# Patient Record
Sex: Female | Born: 1975 | Race: White | Hispanic: No | Marital: Single | Smoking: Never smoker
Health system: Southern US, Community
[De-identification: ages and names within clinical notes are randomized; demographics above are authoritative.]

## PROBLEM LIST (undated history)

## (undated) DIAGNOSIS — M797 Fibromyalgia: Secondary | ICD-10-CM

## (undated) DIAGNOSIS — M1990 Unspecified osteoarthritis, unspecified site: Secondary | ICD-10-CM

## (undated) DIAGNOSIS — K219 Gastro-esophageal reflux disease without esophagitis: Secondary | ICD-10-CM

## (undated) DIAGNOSIS — E039 Hypothyroidism, unspecified: Secondary | ICD-10-CM

## (undated) HISTORY — DX: Hypothyroidism, unspecified: E03.9

## (undated) HISTORY — DX: Fibromyalgia: M79.7

## (undated) HISTORY — DX: Unspecified osteoarthritis, unspecified site: M19.90

## (undated) HISTORY — DX: Gastro-esophageal reflux disease without esophagitis: K21.9

## (undated) HISTORY — DX: Morbid (severe) obesity due to excess calories: E66.01

## (undated) HISTORY — PX: KNEE SURGERY: SHX244

---

## 2010-05-22 DIAGNOSIS — M797 Fibromyalgia: Secondary | ICD-10-CM

## 2010-05-22 HISTORY — DX: Fibromyalgia: M79.7

## 2011-10-21 HISTORY — PX: JOINT REPLACEMENT: SHX530

## 2013-11-03 ENCOUNTER — Ambulatory Visit (INDEPENDENT_AMBULATORY_CARE_PROVIDER_SITE_OTHER): Payer: BC Managed Care – PPO | Admitting: Family Medicine

## 2013-11-03 ENCOUNTER — Encounter (INDEPENDENT_AMBULATORY_CARE_PROVIDER_SITE_OTHER): Payer: Self-pay

## 2013-11-03 VITALS — BP 127/82 | HR 90 | Temp 97.6°F | Ht 62.25 in | Wt 240.0 lb

## 2013-11-03 DIAGNOSIS — R0789 Other chest pain: Secondary | ICD-10-CM

## 2013-11-03 DIAGNOSIS — M797 Fibromyalgia: Secondary | ICD-10-CM | POA: Insufficient documentation

## 2013-11-03 DIAGNOSIS — R059 Cough, unspecified: Secondary | ICD-10-CM

## 2013-11-03 DIAGNOSIS — R05 Cough: Secondary | ICD-10-CM | POA: Insufficient documentation

## 2013-11-03 DIAGNOSIS — R071 Chest pain on breathing: Secondary | ICD-10-CM

## 2013-11-03 DIAGNOSIS — IMO0001 Reserved for inherently not codable concepts without codable children: Secondary | ICD-10-CM

## 2013-11-03 MED ORDER — SUCRALFATE 1 GM/10ML PO SUSP: 1.0000 g | Freq: Two times a day (BID) | ORAL | Status: DC

## 2013-11-03 NOTE — Patient Instructions (Signed)
Finish antibiotic  If pain continues - you may need to see pulmonary and/or get an MRI of thoracic spine. Decrease mobic to 1/2 tab daily.

## 2013-11-03 NOTE — Progress Notes (Signed)
Subjective:    Patient ID: Christy Marshall, female    DOB: Jan 15, 1976, 38 y.o.   MRN: [id number]  HPI Patient here today for right side rib pain x 3 months. She has also had a cough and congestion for the same amount of time. She is currently taking Levaquin prescribed by [facility].         There are no active problems to display for this patient.  Outpatient Encounter Prescriptions as of 11/03/2013  Medication Sig  . DULoxetine (CYMBALTA) 60 MG capsule Take 60 mg by mouth daily.  . lansoprazole (PREVACID) 15 MG capsule Take 15 mg by mouth 2 (two) times daily before a meal.  . levothyroxine (SYNTHROID, LEVOTHROID) 25 MCG tablet Take 25 mcg by mouth daily before breakfast.  . meloxicam (MOBIC) 7.5 MG tablet Take 7.5 mg by mouth daily.    Review of Systems  Constitutional: Negative.   HENT: Positive for congestion.   Eyes: Negative.   Respiratory: Positive for cough. Negative for shortness of breath.   Cardiovascular: Negative.   Gastrointestinal: Negative.   Endocrine: Negative.   Genitourinary: Negative.        Right side rib pain  Musculoskeletal: Negative.   Skin: Negative.   Allergic/Immunologic: Negative.   Neurological: Negative.   Hematological: Negative.   Psychiatric/Behavioral: Negative.        Objective:   Physical Exam  Nursing note and vitals reviewed. Constitutional: She is oriented to person, place, and time. She appears well-developed and well-nourished. No distress.  HENT:  Head: Normocephalic and atraumatic.  Right Ear: External ear normal.  Left Ear: External ear normal.  Nose: Nose normal.  Mouth/Throat: Oropharynx is clear and moist.  Eyes: Conjunctivae and EOM are normal. Pupils are equal, round, and reactive to light. Right eye exhibits no discharge. Left eye exhibits no discharge. No scleral icterus.  Neck: Normal range of motion. Neck supple. No thyromegaly present.  Cardiovascular: Normal rate, regular rhythm, normal heart  sounds and intact distal pulses.  Exam reveals no gallop and no friction rub.   No murmur heard. Pulmonary/Chest: Effort normal and breath sounds normal. No respiratory distress. She has no wheezes. She has no rales. She exhibits no tenderness.  Breath sounds were good bilaterally. There was tenderness over the right lateral rib cage and the upper thoracic spine on the right side.  Abdominal: Soft. Bowel sounds are normal.  Musculoskeletal: Normal range of motion. She exhibits tenderness. She exhibits no edema.  Right lateral rib cage  Lymphadenopathy:    She has no cervical adenopathy.  Neurological: She is alert and oriented to person, place, and time.  Skin: Skin is warm and dry. No rash noted.  Psychiatric: She has a normal mood and affect. Her behavior is normal. Judgment and thought content normal.   BP 127/82  Pulse 90  Temp(Src) 97.6 F (36.4 C) (Oral)  Ht 5' 2.25" (1.581 m)  Wt 240 lb (108.863 kg)  BMI 43.55 kg/m2  LMP 10/13/2013        Assessment & Plan:   1. Right-sided chest wall pain  2. Fibromyalgia  3. Cough Patient Instructions  Finish antibiotic  If pain continues - you may need to see pulmonary and/or get an MRI of thoracic spine. Decrease mobic to 1/2 tab daily.    Meds ordered this encounter  Medications  . meloxicam (MOBIC) 7.5 MG tablet    Sig: Take 7.5 mg by mouth daily.  . lansoprazole (PREVACID) 15 MG capsule  Sig: Take 15 mg by mouth 2 (two) times daily before a meal.  . DULoxetine (CYMBALTA) 60 MG capsule    Sig: Take 60 mg by mouth daily.  Marland Kitchen. levothyroxine (SYNTHROID, LEVOTHROID) 25 MCG tablet    Sig: Take 25 mcg by mouth daily before breakfast.  . sucralfate (CARAFATE) 1 GM/10ML suspension    Sig: Take 10 mLs (1 g total) by mouth 2 (two) times daily.    Dispense:  420 mL    Refill:  1   Christy Capeson W. Quientin Jent MD

## 2014-01-01 ENCOUNTER — Ambulatory Visit (INDEPENDENT_AMBULATORY_CARE_PROVIDER_SITE_OTHER): Payer: BC Managed Care – PPO | Admitting: Family Medicine

## 2014-01-01 VITALS — BP 112/75 | HR 85 | Temp 98.1°F | Ht 62.25 in | Wt 231.0 lb

## 2014-01-01 DIAGNOSIS — J029 Acute pharyngitis, unspecified: Secondary | ICD-10-CM

## 2014-01-01 DIAGNOSIS — J069 Acute upper respiratory infection, unspecified: Secondary | ICD-10-CM

## 2014-01-01 LAB — POCT RAPID STREP A (OFFICE): Rapid Strep A Screen: NEGATIVE

## 2014-01-01 MED ORDER — AMOXICILLIN 875 MG PO TABS: 875.0000 mg | ORAL_TABLET | Freq: Two times a day (BID) | ORAL | Status: DC

## 2014-01-01 MED ORDER — FLUCONAZOLE 150 MG PO TABS
150.0000 mg | ORAL_TABLET | Freq: Once | ORAL | Status: DC
Start: 2014-01-01 — End: 2014-05-07

## 2014-01-01 NOTE — Progress Notes (Signed)
   Subjective:    Patient ID: Christy Marshall, female    DOB: 16-Dec-1975, 38 y.o.   MRN: [id number]  HPI  This 38 y.o. female presents for evaluation of URI sx's and sore throat.  Review of Systems C/o sore throat   No chest pain, SOB, HA, dizziness, vision change, N/V, diarrhea, constipation, dysuria, urinary urgency or frequency, myalgias, arthralgias or rash.  Objective:   Physical Exam Vital signs noted  Well developed well nourished female.  HEENT - Head atraumatic Normocephalic                Eyes - PERRLA, Conjuctiva - clear Sclera- Clear EOMI                Ears - EAC's Wnl TM's Wnl Gross Hearing WNL                 Throat - oropharanx wnl Respiratory - Lungs CTA bilateral Cardiac - RRR S1 and S2 without murmur GI - Abdomen soft Nontender and bowel sounds active x 4 Extremities - No edema. Neuro - Grossly intact.  Results for orders placed in visit on 01/01/14  POCT RAPID STREP A (OFFICE)      Result Value Ref Range   Rapid Strep A Screen Negative  Negative        Assessment & Plan:  Sore throat - Plan: POCT rapid strep A, fluconazole (DIFLUCAN) 150 MG tablet, amoxicillin (AMOXIL) 875 MG tablet  URI (upper respiratory infection) - Plan: fluconazole (DIFLUCAN) 150 MG tablet, amoxicillin (AMOXIL) 875 MG tablet  Push po fluids, rest, tylenol and motrin otc prn as directed for fever, arthralgias, and myalgias.  Follow up prn if sx's continue or persist.  Deatra CanterWilliam J Braeden Dolinski FNP

## 2014-05-07 ENCOUNTER — Ambulatory Visit (INDEPENDENT_AMBULATORY_CARE_PROVIDER_SITE_OTHER): Payer: BC Managed Care – PPO | Admitting: Nurse Practitioner

## 2014-05-07 ENCOUNTER — Encounter: Payer: Self-pay | Admitting: Nurse Practitioner

## 2014-05-07 VITALS — BP 128/86 | HR 83 | Temp 97.7°F | Ht 62.25 in | Wt 246.0 lb

## 2014-05-07 DIAGNOSIS — J0100 Acute maxillary sinusitis, unspecified: Secondary | ICD-10-CM

## 2014-05-07 MED ORDER — AZITHROMYCIN 250 MG PO TABS: ORAL_TABLET | ORAL | Status: DC

## 2014-05-07 MED ORDER — BENZONATATE 100 MG PO CAPS: 100.0000 mg | ORAL_CAPSULE | Freq: Two times a day (BID) | ORAL | Status: DC | PRN

## 2014-05-07 NOTE — Patient Instructions (Signed)

## 2014-05-07 NOTE — Progress Notes (Signed)
Subjective:    Patient ID: Christy Marshall, female    DOB: [birth year], 38 y.o.   MRN: [id number]  HPI Patient in today c/o cough and congestion that started 1 week ago- Went to urgent care a week ago Sunday and was given amocicillin and prednosone. Felt good for 4-5 days and now feels sick again-     Review of Systems  Constitutional: Negative for fever, chills and appetite change.  HENT: Positive for congestion, ear pain, postnasal drip and rhinorrhea. Negative for trouble swallowing and voice change.   Respiratory: Positive for cough (dry).   Cardiovascular: Negative.   Genitourinary: Negative.   Musculoskeletal: Negative.   Neurological: Negative.   Psychiatric/Behavioral: Negative.   All other systems reviewed and are negative.      Objective:   Physical Exam  Constitutional: She is oriented to person, place, and time. She appears well-developed and well-nourished. No distress.  HENT:  Right Ear: Hearing, tympanic membrane, external ear and ear canal normal.  Left Ear: Hearing, tympanic membrane, external ear and ear canal normal.  Nose: Mucosal edema and rhinorrhea present. Right sinus exhibits maxillary sinus tenderness. Right sinus exhibits no frontal sinus tenderness. Left sinus exhibits maxillary sinus tenderness. Left sinus exhibits no frontal sinus tenderness.  Mouth/Throat: Uvula is midline and oropharynx is clear and moist.  Eyes: Pupils are equal, round, and reactive to light.  Neck: Normal range of motion. Neck supple.  Cardiovascular: Normal rate, regular rhythm and normal heart sounds.   Pulmonary/Chest: Effort normal and breath sounds normal.  Lymphadenopathy:    She has no cervical adenopathy.  Neurological: She is alert and oriented to person, place, and time.  Skin: Skin is warm and dry.  Psychiatric: She has a normal mood and affect. Her behavior is normal. Judgment and thought content normal.   BP 128/86 mmHg  Pulse 83  Temp(Src) 97.7 F (36.5 C) (Oral)   Ht 5' 2.25" (1.581 m)  Wt 246 lb (111.585 kg)  BMI 44.64 kg/m2  LMP 04/16/2014        Assessment & Plan:   1. Acute maxillary sinusitis, recurrence not specified    Meds ordered this encounter  Medications  . azithromycin (ZITHROMAX Z-PAK) 250 MG tablet    Sig: As directed    Dispense:  6 each    Refill:  0    Order Specific Question:  Supervising Provider    Answer:  Ernestina PennaMOORE, DONALD W [[id number]]  . benzonatate (TESSALON) 100 MG capsule    Sig: Take 1 capsule (100 mg total) by mouth 2 (two) times daily as needed for cough.    Dispense:  20 capsule    Refill:  0    Order Specific Question:  Supervising Provider    Answer:  Ernestina PennaMOORE, DONALD W [[id number]]   1. Take meds as prescribed 2. Use a cool mist humidifier especially during the winter months and when heat has been humid. 3. Use saline nose sprays frequently 4. Saline irrigations of the nose can be very helpful if done frequently.  * 4X daily for 1 week*  * Use of a nettie pot can be helpful with this. Follow directions with this* 5. Drink plenty of fluids 6. Keep thermostat turn down low 7.For any cough or congestion  Use plain Mucinex- regular strength or max strength is fine   * Children- consult with Pharmacist for dosing 8. For fever or aces or pains- take tylenol or ibuprofen appropriate for age and weight.  * for fevers greater than  101 orally you may alternate ibuprofen and tylenol every  3 hours.   Mary-Margaret Daphine DeutscherMartin, FNP

## 2014-06-10 ENCOUNTER — Encounter: Payer: Self-pay | Admitting: Family Medicine

## 2014-06-10 ENCOUNTER — Other Ambulatory Visit: Payer: Self-pay | Admitting: Family Medicine

## 2014-06-10 ENCOUNTER — Ambulatory Visit (INDEPENDENT_AMBULATORY_CARE_PROVIDER_SITE_OTHER): Payer: BLUE CROSS/BLUE SHIELD

## 2014-06-10 ENCOUNTER — Ambulatory Visit (INDEPENDENT_AMBULATORY_CARE_PROVIDER_SITE_OTHER): Payer: BLUE CROSS/BLUE SHIELD | Admitting: Family Medicine

## 2014-06-10 VITALS — BP 117/75 | HR 92 | Temp 97.6°F | Ht 62.25 in | Wt 243.0 lb

## 2014-06-10 DIAGNOSIS — M79671 Pain in right foot: Secondary | ICD-10-CM

## 2014-06-10 DIAGNOSIS — E039 Hypothyroidism, unspecified: Secondary | ICD-10-CM

## 2014-06-10 DIAGNOSIS — Z0000 Encounter for general adult medical examination without abnormal findings: Secondary | ICD-10-CM

## 2014-06-10 DIAGNOSIS — M1990 Unspecified osteoarthritis, unspecified site: Secondary | ICD-10-CM

## 2014-06-10 DIAGNOSIS — M797 Fibromyalgia: Secondary | ICD-10-CM

## 2014-06-10 DIAGNOSIS — K219 Gastro-esophageal reflux disease without esophagitis: Secondary | ICD-10-CM

## 2014-06-10 MED ORDER — LEVOTHYROXINE SODIUM 25 MCG PO TABS: 25.0000 ug | ORAL_TABLET | Freq: Every day | ORAL | Status: DC

## 2014-06-10 MED ORDER — LANSOPRAZOLE 15 MG PO CPDR: 15.0000 mg | DELAYED_RELEASE_CAPSULE | Freq: Two times a day (BID) | ORAL | Status: DC

## 2014-06-10 MED ORDER — DULOXETINE HCL 60 MG PO CPEP
60.0000 mg | ORAL_CAPSULE | Freq: Every day | ORAL | Status: DC
Start: 2014-06-10 — End: 2015-04-16

## 2014-06-10 MED ORDER — MELOXICAM 7.5 MG PO TABS: 7.5000 mg | ORAL_TABLET | Freq: Every day | ORAL | Status: DC

## 2014-06-10 NOTE — Patient Instructions (Addendum)
Continue current medications. Continue good therapeutic lifestyle changes which include good diet and exercise. Fall precautions discussed with patient. If an FOBT was given today- please return it to our front desk.    We will call you with the results of the x-rays and we may need to do a referral to Dr. Victorino DikeHewitt an orthopedic specialist who deals with extremities. Return to clinic for fasting lab work so we can monitor your thyroid issues We will also do blood work to check your cholesterol your hemoglobin and your kidney function test for culture taken meloxicam.

## 2014-06-10 NOTE — Addendum Note (Signed)
Addended by: Magdalene RiverBULLINS, Nevan Creighton H on: 06/10/2014 03:34 PM   Modules accepted: Orders

## 2014-06-10 NOTE — Progress Notes (Signed)
Subjective:    Patient ID: Christy Marshall, female    DOB: 10-01-1975, 39 y.o.   MRN: [id number]  HPI Patient here today for check up on chronic health problems. She is taking all medications regularly. The patient is hypothyroid and has fibromyalgia. She takes meloxicam and 25 g of Synthroid. The pain in the right heel has been going on for some time and she denies any injury to this pill. It is excruciating according to her. It is worse after she gets up from a sitting position but it hurts also during the day with walking on it. The patient is also requesting refills on several of her medicines and we will do this for her today and she is continued take them regularly and does have to take the meloxicam for her shoulder pain and her fibromyalgia.        Patient Active Problem List   Diagnosis Date Noted  . Cough 11/03/2013  . Fibromyalgia 11/03/2013   Outpatient Encounter Prescriptions as of 06/10/2014  Medication Sig  . DULoxetine (CYMBALTA) 60 MG capsule Take 60 mg by mouth daily.  . lansoprazole (PREVACID) 15 MG capsule Take 15 mg by mouth 2 (two) times daily before a meal.  . levothyroxine (SYNTHROID, LEVOTHROID) 25 MCG tablet Take 25 mcg by mouth daily before breakfast.  . meloxicam (MOBIC) 7.5 MG tablet Take 7.5 mg by mouth daily.  . [DISCONTINUED] azithromycin (ZITHROMAX Z-PAK) 250 MG tablet As directed  . [DISCONTINUED] benzonatate (TESSALON) 100 MG capsule Take 1 capsule (100 mg total) by mouth 2 (two) times daily as needed for cough.    Review of Systems  Constitutional: Negative.   HENT: Negative.   Eyes: Negative.   Respiratory: Negative.   Cardiovascular: Negative.   Gastrointestinal: Negative.   Endocrine: Negative.   Genitourinary: Negative.   Musculoskeletal: Positive for arthralgias (right heel pain).  Skin: Negative.   Allergic/Immunologic: Negative.   Neurological: Negative.   Hematological: Negative.   Psychiatric/Behavioral: Negative.          Objective:   Physical Exam  Constitutional: She is oriented to person, place, and time. She appears well-developed and well-nourished. No distress.  HENT:  Head: Normocephalic and atraumatic.  Eyes: Conjunctivae and EOM are normal. Pupils are equal, round, and reactive to light. Right eye exhibits no discharge. Left eye exhibits no discharge. No scleral icterus.  Neck: Normal range of motion. Neck supple. No thyromegaly present.  Cardiovascular: Normal rate, regular rhythm, normal heart sounds and intact distal pulses.   No murmur heard. At 72/m  Pulmonary/Chest: Effort normal and breath sounds normal. No respiratory distress. She has no wheezes. She has no rales. She exhibits no tenderness.  Abdominal: Soft. There is no tenderness. There is no rebound and no guarding.  Musculoskeletal: Normal range of motion. She exhibits no edema or tenderness.  She has had surgery on both knees and was complaining also of numbness anterior to both knees. There is no pain just numbness. She has good mobility without any redness or inflammation noted  Lymphadenopathy:    She has no cervical adenopathy.  Neurological: She is alert and oriented to person, place, and time. No cranial nerve deficit.  Skin: Skin is warm and dry. No rash noted. No erythema.  Psychiatric: She has a normal mood and affect. Her behavior is normal. Judgment and thought content normal.  Nursing note and vitals reviewed.  BP 117/75 mmHg  Pulse 92  Temp(Src) 97.6 F (36.4 C) (Oral)  Ht 5' 2.25" (1.581  m)  Wt 243 lb (110.224 kg)  BMI 44.10 kg/m2  LMP 05/13/2014  [facility] reading (PRIMARY) by  Dr. Governor Rooks heel--  No heel spur or no reason for heel pain                                      Assessment & Plan:  1. Hypothyroidism, unspecified hypothyroidism type -We will check a thyroid profile and make any adjustments with the medicine when the results are back. For now continue 25 g of thyroid daily - POCT CBC; Future -  Thyroid Panel With TSH; Future  2. Gastroesophageal reflux disease, esophagitis presence not specified -Continue to protect her stomach from the meloxicam. Take as little as you can get by with an Allis take it after eating. - POCT CBC; Future - Hepatic function panel; Future  3. Fibromyalgia -Continue with meloxicam and Cymbalta - POCT CBC; Future  4. Arthritis -Continue meloxicam - POCT CBC; Future  5. Health care maintenance - POCT CBC; Future - BMP8+EGFR; Future - Hepatic function panel; Future - NMR, lipoprofile; Future - Thyroid Panel With TSH; Future - Vit D  25 hydroxy (rtn osteoporosis monitoring); Future  6. Heel pain, right -X-ray will be reviewed and appropriate referral will be made depending on findings. - DG Foot Complete Right; Future   All medications will be refilled for this patient during the visit today.  Patient Instructions  Continue current medications. Continue good therapeutic lifestyle changes which include good diet and exercise. Fall precautions discussed with patient. If an FOBT was given today- please return it to our front desk.    We will call you with the results of the x-rays and we may need to do a referral to Dr. Doran Durand an orthopedic specialist who deals with extremities. Return to clinic for fasting lab work so we can monitor your thyroid issues We will also do blood work to check your cholesterol your hemoglobin and your kidney function test for culture taken meloxicam.   Arrie Senate MD

## 2014-06-17 ENCOUNTER — Encounter (INDEPENDENT_AMBULATORY_CARE_PROVIDER_SITE_OTHER): Payer: Self-pay

## 2014-06-17 ENCOUNTER — Other Ambulatory Visit (INDEPENDENT_AMBULATORY_CARE_PROVIDER_SITE_OTHER): Payer: BLUE CROSS/BLUE SHIELD

## 2014-06-17 DIAGNOSIS — M1990 Unspecified osteoarthritis, unspecified site: Secondary | ICD-10-CM

## 2014-06-17 DIAGNOSIS — M797 Fibromyalgia: Secondary | ICD-10-CM

## 2014-06-17 DIAGNOSIS — E039 Hypothyroidism, unspecified: Secondary | ICD-10-CM

## 2014-06-17 DIAGNOSIS — Z0000 Encounter for general adult medical examination without abnormal findings: Secondary | ICD-10-CM

## 2014-06-17 DIAGNOSIS — K219 Gastro-esophageal reflux disease without esophagitis: Secondary | ICD-10-CM

## 2014-06-17 LAB — POCT CBC
GRANULOCYTE PERCENT: 76 % (ref 37–80)
HEMATOCRIT: 39.8 % (ref 37.7–47.9)
Hemoglobin: 12.8 g/dL (ref 12.2–16.2)
Lymph, poc: 1.6 (ref 0.6–3.4)
MCH: 26.9 pg — AB (ref 27–31.2)
MCHC: 32.2 g/dL (ref 31.8–35.4)
MCV: 83.4 fL (ref 80–97)
MPV: 10.6 fL (ref 0–99.8)
POC Granulocyte: 5.5 (ref 2–6.9)
POC LYMPH PERCENT: 22.2 %L (ref 10–50)
Platelet Count, POC: 210 10*3/uL (ref 142–424)
RBC: 4.8 M/uL (ref 4.04–5.48)
RDW, POC: 14.4 %
WBC: 7.2 10*3/uL (ref 4.6–10.2)

## 2014-06-17 NOTE — Progress Notes (Signed)
Lab only 

## 2014-06-18 LAB — NMR, LIPOPROFILE
Cholesterol: 137 mg/dL (ref 100–199)
HDL Cholesterol by NMR: 35 mg/dL — ABNORMAL LOW (ref >39)
HDL Particle Number: 29.3 umol/L — ABNORMAL LOW (ref >=30.5)
LDL Particle Number: 882 nmol/L (ref <1000)
LDL Size: 20.5 nm (ref >20.5)
LDL-C: 84 mg/dL (ref 0–99)
LP-IR Score: 66 — ABNORMAL HIGH (ref <=45)
SMALL LDL PARTICLE NUMBER: 609 nmol/L — AB (ref <=527)
TRIGLYCERIDES BY NMR: 90 mg/dL (ref 0–149)

## 2014-06-18 LAB — HEPATIC FUNCTION PANEL
ALBUMIN: 4.2 g/dL (ref 3.5–5.5)
ALT: 21 IU/L (ref 0–32)
AST: 36 IU/L (ref 0–40)
Alkaline Phosphatase: 87 IU/L (ref 39–117)
Bilirubin, Direct: 0.1 mg/dL (ref 0.00–0.40)
Total Bilirubin: 0.3 mg/dL (ref 0.0–1.2)
Total Protein: 7.1 g/dL (ref 6.0–8.5)

## 2014-06-18 LAB — BMP8+EGFR
BUN/Creatinine Ratio: 21 (ref 9–23)
BUN: 14 mg/dL (ref 6–24)
CALCIUM: 9.4 mg/dL (ref 8.7–10.2)
CO2: 27 mmol/L (ref 18–29)
Chloride: 99 mmol/L (ref 97–108)
Creatinine, Ser: 0.67 mg/dL (ref 0.57–1.00)
GFR calc Af Amer: 127 mL/min/{1.73_m2} (ref >59)
GFR calc non Af Amer: 110 mL/min/{1.73_m2} (ref >59)
GLUCOSE: 117 mg/dL — AB (ref 65–99)
POTASSIUM: 4.9 mmol/L (ref 3.5–5.2)
Sodium: 139 mmol/L (ref 134–144)

## 2014-06-18 LAB — THYROID PANEL WITH TSH
FREE THYROXINE INDEX: 2.2 (ref 1.2–4.9)
T3 Uptake Ratio: 27 % (ref 24–39)
T4, Total: 8 ug/dL (ref 4.5–12.0)
TSH: 5.91 u[IU]/mL — ABNORMAL HIGH (ref 0.450–4.500)

## 2014-06-18 LAB — VITAMIN D 25 HYDROXY (VIT D DEFICIENCY, FRACTURES): Vit D, 25-Hydroxy: 30 ng/mL (ref 30.0–100.0)

## 2014-06-29 ENCOUNTER — Other Ambulatory Visit: Payer: Self-pay | Admitting: *Deleted

## 2014-06-29 ENCOUNTER — Encounter: Payer: Self-pay | Admitting: Family Medicine

## 2014-06-29 MED ORDER — LEVOTHYROXINE SODIUM 50 MCG PO TABS: 50.0000 ug | ORAL_TABLET | Freq: Every day | ORAL | Status: DC

## 2014-10-27 ENCOUNTER — Encounter (INDEPENDENT_AMBULATORY_CARE_PROVIDER_SITE_OTHER): Payer: Self-pay

## 2014-10-27 ENCOUNTER — Encounter: Payer: Self-pay | Admitting: Family Medicine

## 2014-10-27 ENCOUNTER — Ambulatory Visit (INDEPENDENT_AMBULATORY_CARE_PROVIDER_SITE_OTHER): Payer: BLUE CROSS/BLUE SHIELD | Admitting: Family Medicine

## 2014-10-27 DIAGNOSIS — R6881 Early satiety: Secondary | ICD-10-CM

## 2014-10-27 DIAGNOSIS — R140 Abdominal distension (gaseous): Secondary | ICD-10-CM | POA: Diagnosis not present

## 2014-10-27 DIAGNOSIS — Z0100 Encounter for examination of eyes and vision without abnormal findings: Secondary | ICD-10-CM

## 2014-10-27 DIAGNOSIS — E039 Hypothyroidism, unspecified: Secondary | ICD-10-CM

## 2014-10-27 DIAGNOSIS — Z0000 Encounter for general adult medical examination without abnormal findings: Secondary | ICD-10-CM

## 2014-10-27 DIAGNOSIS — R198 Other specified symptoms and signs involving the digestive system and abdomen: Secondary | ICD-10-CM

## 2014-10-27 DIAGNOSIS — M797 Fibromyalgia: Secondary | ICD-10-CM | POA: Diagnosis not present

## 2014-10-27 DIAGNOSIS — K219 Gastro-esophageal reflux disease without esophagitis: Secondary | ICD-10-CM

## 2014-10-27 LAB — POCT CBC
GRANULOCYTE PERCENT: 75.2 % (ref 37–80)
HEMATOCRIT: 39.1 % (ref 37.7–47.9)
Hemoglobin: 12.1 g/dL — AB (ref 12.2–16.2)
LYMPH, POC: 1.8 (ref 0.6–3.4)
MCH, POC: 26 pg — AB (ref 27–31.2)
MCHC: 31.1 g/dL — AB (ref 31.8–35.4)
MCV: 83.7 fL (ref 80–97)
MPV: 10.2 fL (ref 0–99.8)
PLATELET COUNT, POC: 210 10*3/uL (ref 142–424)
POC Granulocyte: 5.9 (ref 2–6.9)
POC LYMPH PERCENT: 22.3 %L (ref 10–50)
RBC: 4.67 M/uL (ref 4.04–5.48)
RDW, POC: 14.6 %
WBC: 7.9 10*3/uL (ref 4.6–10.2)

## 2014-10-27 MED ORDER — LANSOPRAZOLE 15 MG PO CPDR
15.0000 mg | DELAYED_RELEASE_CAPSULE | Freq: Two times a day (BID) | ORAL | Status: DC
Start: 2014-10-27 — End: 2014-10-27

## 2014-10-27 MED ORDER — LANSOPRAZOLE 30 MG PO CPDR: 30.0000 mg | DELAYED_RELEASE_CAPSULE | Freq: Two times a day (BID) | ORAL | Status: AC

## 2014-10-27 NOTE — Addendum Note (Signed)
Addended by: Magdalene RiverBULLINS, JAMIE H on: 10/27/2014 10:09 AM   Modules accepted: Orders

## 2014-10-27 NOTE — Patient Instructions (Addendum)
Continue current medications. Continue good therapeutic lifestyle changes which include good diet and exercise. Fall precautions discussed with patient. If an FOBT was given today- please return it to our front desk.  Flu Shots are still available at our office. If you still haven't had one please call to set up a nurse visit to get one.   After your visit with us today you will receive a survey in the mail or online from [employer] regarding your care with us. Please take a moment to fill this out. Your feedback is very important to us as you can help us better understand your patient needs as well as improve your experience and satisfaction. WE CARE ABOUT YOU!!!   Because of the patient's feeling of satiety and epigastric discomfort she will be scheduled to see a gastroenterologist for further evaluation Also because of her morbid obesity, she is interested in doing some type of gastric bypass and we will arrange for her to see Dr. Ovidio Kinavid Newman to discuss this more. She will also be scheduled for a visit with the ophthalmologist, Dr. Dione BoozeGroat The patient should continue walking and exercises as much as possible she continued to watch her diet as closely as possible

## 2014-10-27 NOTE — Progress Notes (Signed)
Subjective:    Patient ID: Christy Marshall, female    DOB: Jan 19, 1976, 39 y.o.   MRN: [id number]  HPI Pt here for follow up and management of chronic medical problems which includes GERD, hypothyroid, and fibromyalgia. She is taking medications regularly. The patient's main complaint today is abdominal pain and bloating after eating. The patient's BMI is 45. This puts her in the morbid obesity category. She is also considering some form of gastric bypass surgery. She does have a history of fibromyalgia. She is only taking Prevacid 15 mg daily.      Patient Active Problem List   Diagnosis Date Noted  . Cough 11/03/2013  . Fibromyalgia 11/03/2013   Outpatient Encounter Prescriptions as of 10/27/2014  Medication Sig  . DULoxetine (CYMBALTA) 60 MG capsule Take 1 capsule (60 mg total) by mouth daily.  . lansoprazole (PREVACID) 15 MG capsule Take 1 capsule (15 mg total) by mouth 2 (two) times daily before a meal.  . levothyroxine (SYNTHROID, LEVOTHROID) 50 MCG tablet Take 1 tablet (50 mcg total) by mouth daily.  . meloxicam (MOBIC) 7.5 MG tablet Take 1 tablet (7.5 mg total) by mouth daily.   No facility-administered encounter medications on file as of 10/27/2014.      Review of Systems  Constitutional: Negative.   HENT: Negative.   Eyes: Negative.   Respiratory: Negative.   Cardiovascular: Negative.   Gastrointestinal: Positive for abdominal pain (after eating).       Bloating  Endocrine: Negative.   Genitourinary: Negative.   Musculoskeletal: Negative.   Skin: Negative.   Allergic/Immunologic: Negative.   Neurological: Negative.   Hematological: Negative.   Psychiatric/Behavioral: Negative.        Objective:   Physical Exam  Constitutional: She is oriented to person, place, and time. She appears well-developed and well-nourished. No distress.  The patient is pleasant and alert.  HENT:  Head: Normocephalic and atraumatic.  Right Ear: External ear normal.  Left Ear: External  ear normal.  Nose: Nose normal.  Mouth/Throat: Oropharynx is clear and moist. No oropharyngeal exudate.  Eyes: Conjunctivae and EOM are normal. Pupils are equal, round, and reactive to light. Right eye exhibits no discharge. Left eye exhibits no discharge. No scleral icterus.  Neck: Normal range of motion. Neck supple. No thyromegaly present.  Cardiovascular: Normal rate, regular rhythm, normal heart sounds and intact distal pulses.   No murmur heard. At 72/m  Pulmonary/Chest: Effort normal and breath sounds normal. No respiratory distress. She has no wheezes. She has no rales. She exhibits no tenderness.  Clear anteriorly and posteriorly  Abdominal: Soft. Bowel sounds are normal. She exhibits no mass. There is tenderness. There is no rebound and no guarding.  There is epigastric tenderness without organ enlargement  Musculoskeletal: Normal range of motion. She exhibits no edema or tenderness.  The patient has had 2 previous knee surgeries.  Lymphadenopathy:    She has no cervical adenopathy.  Neurological: She is alert and oriented to person, place, and time. She has normal reflexes. No cranial nerve deficit.  Skin: Skin is warm and dry. No rash noted.  Psychiatric: She has a normal mood and affect. Her behavior is normal. Judgment and thought content normal.  Nursing note and vitals reviewed.  BP 110/82 mmHg  Pulse 84  Temp(Src) 97.8 F (36.6 C) (Oral)  Ht 5' 2.25" (1.581 m)  Wt 251 lb (113.853 kg)  BMI 45.55 kg/m2  LMP 10/20/2014  Results for orders placed or performed in visit on 10/27/14  POCT CBC  Result Value Ref Range   WBC 7.9 4.6 - 10.2 K/uL   Lymph, poc 1.8 0.6 - 3.4   POC LYMPH PERCENT 22.3 10 - 50 %L   POC Granulocyte 5.9 2 - 6.9   Granulocyte percent 75.2 37 - 80 %G   RBC 4.67 4.04 - 5.48 M/uL   Hemoglobin 12.1 (A) 12.2 - 16.2 g/dL   HCT, POC 39.1 37.7 - 47.9 %   MCV 83.7 80 - 97 fL   MCH, POC 26.0 (A) 27 - 31.2 pg   MCHC 31.1 (A) 31.8 - 35.4 g/dL   RDW, POC  14.6 %   Platelet Count, POC 210 142 - 424 K/uL   MPV 10.2 0 - 99.8 fL         Assessment & Plan:  1. Hypothyroidism, unspecified hypothyroidism type -The patient is feeling well regarding this and she will continue with current treatment pending results of lab work today - POCT CBC - Thyroid Panel With TSH  2. Gastroesophageal reflux disease, esophagitis presence not specified -The patient has satiety and epigastric tenderness and has never had an endoscopy. We will arrange for her to see the gastroenterologist for further evaluation. She will also try to watch the dairy products in her diet as well as caffeine. - POCT CBC - BMP8+EGFR - Hepatic function panel - NMR, lipoprofile  3. Fibromyalgia -She will continue with her Cymbalta for this and stay as active as possible - POCT CBC  4. Health care maintenance -She needs an eye exam and she will be scheduled to see Dr. Katy Fitch in [place] - Hepatic function panel - NMR, lipoprofile  5. Morbid obesity -She is interested in pursuing gastric bypass surgery. She has consulted with the surgeon before she moved to the local area. She is walking and exercising and trying to watch her diet. We will schedule her for a visit with Dr. Alphonsa Overall to further evaluate the possibility of doing gastric bypass surgery  6. Early satiety -In conjunction with a visit to see Dr. Lucia Gaskins she will be scheduled to see the gastroenterologist for further evaluation and possible endoscopy -She will also return the FOBT card  Meds ordered this encounter  Medications  . DISCONTD: lansoprazole (PREVACID) 15 MG capsule    Sig: Take 1 capsule (15 mg total) by mouth 2 (two) times daily before a meal.    Dispense:  180 capsule    Refill:  3  . lansoprazole (PREVACID) 30 MG capsule    Sig: Take 1 capsule (30 mg total) by mouth 2 (two) times daily before a meal.    Dispense:  60 capsule    Refill:  3   Patient Instructions    Continue current medications. Continue good therapeutic lifestyle changes which include good diet and exercise. Fall precautions discussed with patient. If an FOBT was given today- please return it to our front desk.  Flu Shots are still available at our office. If you still haven't had one please call to set up a nurse visit to get one.   After your visit with [place] today you will receive a survey in the mail or online from [employer] regarding your care with [place]. Please take a moment to fill this out. Your feedback is very important to [place] as you can help [place] better understand your patient needs as well as improve your experience and satisfaction. WE CARE ABOUT YOU!!!   Because of the patient's feeling  of satiety and epigastric discomfort she will be scheduled to see a gastroenterologist for further evaluation Also because of her morbid obesity, she is interested in doing some type of gastric bypass and we will arrange for her to see Dr. Alphonsa Overall to discuss this more. She will also be scheduled for a visit with the ophthalmologist, Dr. Katy Fitch The patient should continue walking and exercises as much as possible she continued to watch her diet as closely as possible   Arrie Senate MD

## 2014-10-28 ENCOUNTER — Encounter: Payer: Self-pay | Admitting: Internal Medicine

## 2014-10-28 LAB — NMR, LIPOPROFILE
CHOLESTEROL: 141 mg/dL (ref 100–199)
HDL CHOLESTEROL BY NMR: 34 mg/dL — AB (ref >39)
HDL Particle Number: 24 umol/L — ABNORMAL LOW (ref >=30.5)
LDL PARTICLE NUMBER: 1235 nmol/L — AB (ref <1000)
LDL SIZE: 20.4 nm (ref >20.5)
LDL-C: 88 mg/dL (ref 0–99)
LP-IR SCORE: 66 — AB (ref <=45)
Small LDL Particle Number: 825 nmol/L — ABNORMAL HIGH (ref <=527)
TRIGLYCERIDES BY NMR: 94 mg/dL (ref 0–149)

## 2014-10-28 LAB — BMP8+EGFR
BUN/Creatinine Ratio: 18 (ref 9–23)
BUN: 13 mg/dL (ref 6–24)
CALCIUM: 9.1 mg/dL (ref 8.7–10.2)
CO2: 24 mmol/L (ref 18–29)
Chloride: 98 mmol/L (ref 97–108)
Creatinine, Ser: 0.72 mg/dL (ref 0.57–1.00)
GFR calc non Af Amer: 105 mL/min/{1.73_m2} (ref >59)
GFR, EST AFRICAN AMERICAN: 121 mL/min/{1.73_m2} (ref >59)
GLUCOSE: 111 mg/dL — AB (ref 65–99)
Potassium: 5.3 mmol/L — ABNORMAL HIGH (ref 3.5–5.2)
SODIUM: 139 mmol/L (ref 134–144)

## 2014-10-28 LAB — THYROID PANEL WITH TSH
Free Thyroxine Index: 2.4 (ref 1.2–4.9)
T3 UPTAKE RATIO: 28 % (ref 24–39)
T4 TOTAL: 8.6 ug/dL (ref 4.5–12.0)
TSH: 2.92 u[IU]/mL (ref 0.450–4.500)

## 2014-10-28 LAB — HEPATIC FUNCTION PANEL
ALBUMIN: 4.1 g/dL (ref 3.5–5.5)
ALT: 23 IU/L (ref 0–32)
AST: 36 IU/L (ref 0–40)
Alkaline Phosphatase: 89 IU/L (ref 39–117)
BILIRUBIN, DIRECT: 0.1 mg/dL (ref 0.00–0.40)
Bilirubin Total: 0.4 mg/dL (ref 0.0–1.2)
TOTAL PROTEIN: 7 g/dL (ref 6.0–8.5)

## 2014-10-29 ENCOUNTER — Telehealth: Payer: Self-pay

## 2014-10-29 NOTE — Telephone Encounter (Signed)
Prevacid 15 mg solutab was prior authorized with insurance

## 2014-12-17 ENCOUNTER — Encounter: Payer: Self-pay | Admitting: *Deleted

## 2014-12-24 ENCOUNTER — Encounter: Payer: Self-pay | Admitting: *Deleted

## 2015-01-04 ENCOUNTER — Ambulatory Visit: Payer: Self-pay | Admitting: Internal Medicine

## 2015-04-16 ENCOUNTER — Other Ambulatory Visit: Payer: Self-pay

## 2015-04-16 MED ORDER — DULOXETINE HCL 60 MG PO CPEP: 60.0000 mg | ORAL_CAPSULE | Freq: Every day | ORAL | Status: DC

## 2015-04-16 MED ORDER — LEVOTHYROXINE SODIUM 50 MCG PO TABS: 50.0000 ug | ORAL_TABLET | Freq: Every day | ORAL | Status: DC

## 2015-04-28 ENCOUNTER — Ambulatory Visit: Payer: BLUE CROSS/BLUE SHIELD | Admitting: Family Medicine

## 2015-04-29 ENCOUNTER — Encounter: Payer: Self-pay | Admitting: Family Medicine

## 2015-04-29 ENCOUNTER — Ambulatory Visit: Payer: BLUE CROSS/BLUE SHIELD | Admitting: Family Medicine

## 2015-07-14 ENCOUNTER — Other Ambulatory Visit: Payer: Self-pay | Admitting: Family Medicine

## 2015-07-14 NOTE — Telephone Encounter (Signed)
Last seen 10/27/14  DWM

## 2015-08-11 ENCOUNTER — Ambulatory Visit (INDEPENDENT_AMBULATORY_CARE_PROVIDER_SITE_OTHER): Payer: No Typology Code available for payment source | Admitting: Family Medicine

## 2015-08-11 ENCOUNTER — Encounter: Payer: Self-pay | Admitting: Family Medicine

## 2015-08-11 VITALS — BP 125/90 | HR 87 | Temp 98.9°F | Ht 62.25 in | Wt 265.4 lb

## 2015-08-11 DIAGNOSIS — J309 Allergic rhinitis, unspecified: Secondary | ICD-10-CM | POA: Diagnosis not present

## 2015-08-11 DIAGNOSIS — Z713 Dietary counseling and surveillance: Secondary | ICD-10-CM

## 2015-08-11 DIAGNOSIS — R7301 Impaired fasting glucose: Secondary | ICD-10-CM | POA: Diagnosis not present

## 2015-08-11 DIAGNOSIS — R590 Localized enlarged lymph nodes: Secondary | ICD-10-CM | POA: Diagnosis not present

## 2015-08-11 LAB — BAYER DCA HB A1C WAIVED: HB A1C (BAYER DCA - WAIVED): 6.6 % (ref <7.0)

## 2015-08-11 MED ORDER — PHENTERMINE HCL 37.5 MG PO CAPS: 37.5000 mg | ORAL_CAPSULE | ORAL | Status: DC

## 2015-08-11 MED ORDER — DULOXETINE HCL 60 MG PO CPEP: 60.0000 mg | ORAL_CAPSULE | Freq: Every day | ORAL | Status: DC

## 2015-08-11 MED ORDER — FLUTICASONE PROPIONATE 50 MCG/ACT NA SUSP: 1.0000 | Freq: Two times a day (BID) | NASAL | Status: DC | PRN

## 2015-08-11 NOTE — Progress Notes (Signed)
BP 125/90 mmHg  Pulse 87  Temp(Src) 98.9 F (37.2 C) (Oral)  Ht 5' 2.25" (1.581 m)  Wt 265 lb 6.4 oz (120.385 kg)  BMI 48.16 kg/m2  LMP 07/14/2015   Subjective:    Patient ID: Leia Alf, female    DOB: 18-Jan-1976, 40 y.o.   MRN: [id number]  HPI: Ilithyia Titzer is a 40 y.o. female presenting on 08/11/2015 for Left ear feels stopped up, sinus congestion, sneezing; Knot underneath right arm; and Discuss weight loss   HPI Sinus congestion and left ear pressure Patient has been having sinus congestion and left ear pressure that is been going on for about a week. She is not currently taking any allergy medications but has had allergies in previous years. She denies any fevers or chills or shortness of breath or wheezing. She has having a cough and postnasal drainage and the cough is mostly nonproductive. She is also having some itchiness around her eyes and some sneezing as well.  Morbid obesity and weight loss discussion Patient is coming in today for discussion about her obesity and weight loss medication. She has previously tried phentermine once and it helped her greatly and she would like to try again for short period time knowing that the recommendations only for about 3 months because of long-term possibility effects. Along with the obesity she also had an elevated fasting glucose in her last visit for which we will do a hemoglobin A1c to see if she actually has diabetes. Patient currently weighs 265 pounds with a BMI of 48.16.  Relevant past medical, surgical, family and social history reviewed and updated as indicated. Interim medical history since our last visit reviewed. Allergies and medications reviewed and updated.  Review of Systems  Constitutional: Negative for fever and chills.  HENT: Positive for postnasal drip, rhinorrhea, sinus pressure, sneezing and sore throat. Negative for congestion, ear discharge and ear pain.   Eyes: Negative for pain, redness and visual disturbance.    Respiratory: Negative for chest tightness and shortness of breath.   Cardiovascular: Negative for chest pain and leg swelling.  Endocrine: Negative for cold intolerance, heat intolerance, polydipsia and polyuria.  Genitourinary: Negative for dysuria and difficulty urinating.  Musculoskeletal: Negative for back pain and gait problem.  Skin: Negative for rash.  Neurological: Negative for light-headedness and headaches.  Psychiatric/Behavioral: Negative for behavioral problems and agitation.  All other systems reviewed and are negative.  Per HPI unless specifically indicated above    Medication List       This list is accurate as of: 08/11/15  9:51 AM.  Always use your most recent med list.               DULoxetine 60 MG capsule  Commonly known as:  CYMBALTA  TAKE 1 CAPSULE (60 MG TOTAL) BY MOUTH DAILY.     fluticasone 50 MCG/ACT nasal spray  Commonly known as:  FLONASE  Place 1 spray into both nostrils 2 (two) times daily as needed for allergies or rhinitis.     lansoprazole 30 MG capsule  Commonly known as:  PREVACID  Take 1 capsule (30 mg total) by mouth 2 (two) times daily before a meal.     levothyroxine 50 MCG tablet  Commonly known as:  SYNTHROID, LEVOTHROID  Take 1 tablet (50 mcg total) by mouth daily.     phentermine 37.5 MG capsule  Take 1 capsule (37.5 mg total) by mouth every morning.  Objective:    BP 125/90 mmHg  Pulse 87  Temp(Src) 98.9 F (37.2 C) (Oral)  Ht 5' 2.25" (1.581 m)  Wt 265 lb 6.4 oz (120.385 kg)  BMI 48.16 kg/m2  LMP 07/14/2015  Wt Readings from Last 3 Encounters:  08/11/15 265 lb 6.4 oz (120.385 kg)  10/27/14 251 lb (113.853 kg)  06/10/14 243 lb (110.224 kg)    Physical Exam  Constitutional: She is oriented to person, place, and time. She appears well-developed and well-nourished. No distress.  Obese  HENT:  Right Ear: Tympanic membrane, external ear and ear canal normal.  Left Ear: Tympanic membrane, external ear  and ear canal normal.  Nose: Mucosal edema and rhinorrhea present. No epistaxis. Right sinus exhibits no maxillary sinus tenderness and no frontal sinus tenderness. Left sinus exhibits no maxillary sinus tenderness and no frontal sinus tenderness.  Mouth/Throat: Uvula is midline and mucous membranes are normal. Posterior oropharyngeal edema and posterior oropharyngeal erythema present. No oropharyngeal exudate or tonsillar abscesses.  Eyes: Conjunctivae and EOM are normal.  Neck: Neck supple. No thyromegaly present.  Cardiovascular: Normal rate, regular rhythm, normal heart sounds and intact distal pulses.   No murmur heard. Pulmonary/Chest: Effort normal and breath sounds normal. No respiratory distress. She has no wheezes.  Musculoskeletal: Normal range of motion. She exhibits no edema or tenderness.  Lymphadenopathy:    She has no cervical adenopathy.  Neurological: She is alert and oriented to person, place, and time. Coordination normal.  Skin: Skin is warm and dry. No rash noted. She is not diaphoretic.  Psychiatric: She has a normal mood and affect. Her behavior is normal.  Vitals reviewed.     Assessment & Plan:   Problem List Items Addressed This Visit    None    Visit Diagnoses    Allergic rhinitis, unspecified allergic rhinitis type    -  Primary    Relevant Medications    fluticasone (FLONASE) 50 MCG/ACT nasal spray    Morbid obesity, unspecified obesity type (HCC)        Relevant Medications    phentermine 37.5 MG capsule    Weight loss counseling, encounter for        Relevant Medications    phentermine 37.5 MG capsule    Axillary lymphadenopathy        Right axilla, been there for a month will monitor    Elevated fasting glucose        Relevant Orders    Bayer DCA Hb A1c Waived (Completed)        Follow up plan: Return in about 4 weeks (around 09/08/2015), or if symptoms worsen or fail to improve, for Recheck weight.  Counseling provided for all of the vaccine  components Orders Placed This Encounter  Procedures  . [facility] Hb A1c Waived    Arville CareJoshua Dettinger, MD [facility] 08/11/2015, 9:51 AM

## 2015-08-12 ENCOUNTER — Telehealth: Payer: Self-pay

## 2015-08-12 MED ORDER — METFORMIN HCL 500 MG PO TABS: 500.0000 mg | ORAL_TABLET | Freq: Two times a day (BID) | ORAL | Status: DC

## 2015-08-12 NOTE — Telephone Encounter (Signed)
[facility] went to [facility] and Metformin 500 mg bid was not there.  Can we go ahead and call in.  I contacted [facility] and gave patient's prescription.

## 2015-08-18 ENCOUNTER — Encounter: Payer: Self-pay | Admitting: Pharmacist

## 2015-08-18 ENCOUNTER — Ambulatory Visit (INDEPENDENT_AMBULATORY_CARE_PROVIDER_SITE_OTHER): Payer: PRIVATE HEALTH INSURANCE | Admitting: Pharmacist

## 2015-08-18 VITALS — BP 124/88 | HR 80 | Ht 62.0 in | Wt 264.0 lb

## 2015-08-18 DIAGNOSIS — E119 Type 2 diabetes mellitus without complications: Secondary | ICD-10-CM | POA: Diagnosis not present

## 2015-08-18 NOTE — Progress Notes (Signed)
Subjective:    Christy Marshall is a 40 y.o. female who presents for an initial evaluation of Type 2 diabetes mellitus.  Christy Marshall was diagnoses with DM 08/11/2015.  A1c was found to be 6.6%.  No family history of DM and no personal history of gestational DM.  Known diabetic complications: none Cardiovascular risk factors: diabetes mellitus and obesity (BMI >= 30 kg/m2) Current diabetic medications include metformin 500mg  1 tablet BID.   Eye exam current (within one year): no Weight trend: stable Prior visit with dietician: no Current diet: in general, an "unhealthy" diet Current exercise: none  Current monitoring regimen: none Home blood sugar records: n/a Any episodes of hypoglycemia? no  Is She on ACE inhibitor or angiotensin II receptor blocker?  No       The following portions of the patient's history were reviewed and updated as appropriate: allergies, current medications, past family history, past medical history, past surgical history and problem list.   Objective:    BP 124/88 mmHg  Pulse 80  Ht 5\' 2"  (1.575 m)  Wt 264 lb (119.75 kg)  BMI 48.27 kg/m2  LMP 07/14/2015  A1c = 6.6% (07/2015) Lab Review GLUCOSE (mg/dL)  Date Value  16/10/960406/11/2014 111*  06/17/2014 117*   CO2 (mmol/L)  Date Value  10/27/2014 24  06/17/2014 27   BUN (mg/dL)  Date Value  54/09/811906/11/2014 13  06/17/2014 14   CREATININE, SER (mg/dL)  Date Value  14/78/295606/11/2014 0.72  06/17/2014 0.67   Assessment:    Diabetes Mellitus type II, under inadequate control.   Obesity - just stated phentermine about 5 days ago.   Plan:    1.  Rx changes: none 2.  Education: Reviewed 'ABCs' of diabetes management (respective goals in parentheses):  A1C (<7), blood pressure (<130/80), and cholesterol (LDL <100). 3.  Discussed CHO counting diet - recommended 45g per meal and 15-20g per snack.  Discussed recommended serving sizes and how to estimate CHO from food label.    4.  Physical activity - start with 10 to 15  minutes daily and work up to 150 minutes per week as able.  5.  Discussed getting eye exam and regular dental visits. Patient to set up appt for eye exam. 6.  Follow up with PCP in 1 month as planned and with me as needed.   Christy Marshall, PharmD, CPP, CDE

## 2015-08-18 NOTE — Patient Instructions (Addendum)
Diabetes and Standards of Medical Care   Diabetes is complicated. You may find that your diabetes team includes a dietitian, nurse, diabetes educator, eye doctor, and more. To help everyone know what is going on and to help you get the care you deserve, the following schedule of care was developed to help keep you on track. Below are the tests, exams, vaccines, medicines, education, and plans you will need.  Blood Glucose Goals Prior to meals = 80 - 130 Within 2 hours of the start of a meal = less than 180  HbA1c test (goal is less than 6.5% - your last value was 6.6%) This test shows how well you have controlled your glucose over the past 2 to 3 months. It is used to see if your diabetes management plan needs to be adjusted.   It is performed at least 2 times a year if you are meeting treatment goals.  It is performed 4 times a year if therapy has changed or if you are not meeting treatment goals.  Blood pressure test  This test is performed at every routine medical visit. The goal is less than 140/90 mmHg for most people, but 130/80 mmHg in some cases. Ask your health care provider about your goal.  Dental exam  Follow up with the dentist regularly.  Eye exam  If you are diagnosed with type 1 diabetes as a child, get an exam upon reaching the age of 10 years or older and have had diabetes for 3 to 5 years. Yearly eye exams are recommended after that initial eye exam.  If you are diagnosed with type 1 diabetes as an adult, get an exam within 5 years of diagnosis and then yearly.  If you are diagnosed with type 2 diabetes, get an exam as soon as possible after the diagnosis and then yearly.  Foot care exam  Visual foot exams are performed at every routine medical visit. The exams check for cuts, injuries, or other problems with the feet.  A comprehensive foot exam should be done yearly. This includes visual inspection as well as assessing foot pulses and testing for loss of  sensation.  Check your feet nightly for cuts, injuries, or other problems with your feet. Tell your health care provider if anything is not healing.  Kidney function test (urine microalbumin)  This test is performed once a year.  Type 1 diabetes: The first test is performed 5 years after diagnosis.  Type 2 diabetes: The first test is performed at the time of diagnosis.  A serum creatinine and estimated glomerular filtration rate (eGFR) test is done once a year to assess the level of chronic kidney disease (CKD), if present.  Lipid profile (cholesterol, HDL, LDL, triglycerides)  Performed every 5 years for most people.  The goal for LDL is less than 100 mg/dL. If you are at high risk, the goal is less than 70 mg/dL.  The goal for HDL is 40 mg/dL to 50 mg/dL for men and 50 mg/dL to 60 mg/dL for women. An HDL cholesterol of 60 mg/dL or higher gives some protection against heart disease.  The goal for triglycerides is less than 150 mg/dL.  Influenza vaccine, pneumococcal vaccine, and hepatitis B vaccine  The influenza vaccine is recommended yearly.  The pneumococcal vaccine is generally given once in a lifetime. However, there are some instances when another vaccination is recommended. Check with your health care provider.  The hepatitis B vaccine is also recommended for adults with diabetes.    Diabetes self-management education  Education is recommended at diagnosis and ongoing as needed.  Treatment plan  Your treatment plan is reviewed at every medical visit.  Document Released: 03/05/2009 Document Revised: 01/08/2013 Document Reviewed: 10/08/2012 [employer] Patient Information 2014 [employer].   

## 2015-08-31 ENCOUNTER — Other Ambulatory Visit: Payer: Self-pay | Admitting: Family Medicine

## 2015-09-15 ENCOUNTER — Ambulatory Visit: Payer: No Typology Code available for payment source | Admitting: Family Medicine

## 2015-10-04 ENCOUNTER — Other Ambulatory Visit: Payer: Self-pay | Admitting: Physician Assistant

## 2015-10-04 ENCOUNTER — Ambulatory Visit (INDEPENDENT_AMBULATORY_CARE_PROVIDER_SITE_OTHER): Payer: PRIVATE HEALTH INSURANCE | Admitting: Physician Assistant

## 2015-10-04 ENCOUNTER — Encounter: Payer: Self-pay | Admitting: Physician Assistant

## 2015-10-04 VITALS — BP 129/92 | HR 101 | Temp 97.2°F | Ht 62.0 in | Wt 253.0 lb

## 2015-10-04 DIAGNOSIS — H6092 Unspecified otitis externa, left ear: Secondary | ICD-10-CM | POA: Diagnosis not present

## 2015-10-04 MED ORDER — NEOMYCIN-POLYMYXIN-HC 3.5-10000-1 OT SOLN: 3.0000 [drp] | Freq: Four times a day (QID) | OTIC | Status: DC

## 2015-10-04 NOTE — Patient Instructions (Signed)

## 2015-10-04 NOTE — Progress Notes (Signed)
Subjective:     Patient ID: Christy Marshall, female   DOB: 03/09/1976, 40 y.o.   MRN: [id number]  HPI L ear pain x 1 week Sx have improved since last night ? Drainage from the ear  Review of Systems  Constitutional: Negative.   HENT: Positive for ear discharge, ear pain, postnasal drip and sinus pressure. Negative for congestion, rhinorrhea, sneezing and sore throat.        Objective:   Physical Exam  Constitutional: She appears well-developed and well-nourished.  HENT:  Right Ear: External ear normal.  Mouth/Throat: Oropharynx is clear and moist.  + TTP of the tragus Sl erythema/edma to the L canal  Nursing note and vitals reviewed.      Assessment:     Left otitis externa      Plan:     Cortisporin Otic OTC meds for sx Nl course reviewed F/U prn

## 2015-10-16 ENCOUNTER — Other Ambulatory Visit: Payer: Self-pay | Admitting: Family Medicine

## 2015-10-30 ENCOUNTER — Other Ambulatory Visit: Payer: Self-pay | Admitting: Family Medicine

## 2015-11-24 ENCOUNTER — Ambulatory Visit (INDEPENDENT_AMBULATORY_CARE_PROVIDER_SITE_OTHER): Payer: PRIVATE HEALTH INSURANCE | Admitting: Family

## 2015-11-24 ENCOUNTER — Encounter: Payer: Self-pay | Admitting: Family

## 2015-11-24 VITALS — BP 143/107 | HR 88 | Temp 97.7°F | Ht 62.0 in | Wt 253.0 lb

## 2015-11-24 DIAGNOSIS — J0110 Acute frontal sinusitis, unspecified: Secondary | ICD-10-CM

## 2015-11-24 MED ORDER — FLUTICASONE PROPIONATE 50 MCG/ACT NA SUSP: 2.0000 | Freq: Every day | NASAL | Status: DC

## 2015-11-24 MED ORDER — AZITHROMYCIN 250 MG PO TABS: ORAL_TABLET | ORAL | Status: DC

## 2015-11-24 NOTE — Patient Instructions (Addendum)
Sinusitis, Adult Sinusitis is redness, soreness, and inflammation of the paranasal sinuses. Paranasal sinuses are air pockets within the bones of your face. They are located beneath your eyes, in the middle of your forehead, and above your eyes. In healthy paranasal sinuses, mucus is able to drain out, and air is able to circulate through them by way of your nose. However, when your paranasal sinuses are inflamed, mucus and air can become trapped. This can allow bacteria and other germs to grow and cause infection. Sinusitis can develop quickly and last only a short time (acute) or continue over a long period (chronic). Sinusitis that lasts for more than 12 weeks is considered chronic. CAUSES Causes of sinusitis include:  Allergies.  Structural abnormalities, such as displacement of the cartilage that separates your nostrils (deviated septum), which can decrease the air flow through your nose and sinuses and affect sinus drainage.  Functional abnormalities, such as when the small hairs (cilia) that line your sinuses and help remove mucus do not work properly or are not present. SIGNS AND SYMPTOMS Symptoms of acute and chronic sinusitis are the same. The primary symptoms are pain and pressure around the affected sinuses. Other symptoms include:  Upper toothache.  Earache.  Headache.  Bad breath.  Decreased sense of smell and taste.  A cough, which worsens when you are lying flat.  Fatigue.  Fever.  Thick drainage from your nose, which often is green and may contain pus (purulent).  Swelling and warmth over the affected sinuses. DIAGNOSIS Your health care provider will perform a physical exam. During your exam, your health care provider may perform any of the following to help determine if you have acute sinusitis or chronic sinusitis:  Look in your nose for signs of abnormal growths in your nostrils (nasal polyps).  Tap over the affected sinus to check for signs of  infection.  View the inside of your sinuses using an imaging device that has a light attached (endoscope). If your health care provider suspects that you have chronic sinusitis, one or more of the following tests may be recommended:  Allergy tests.  Nasal culture. A sample of mucus is taken from your nose, sent to a lab, and screened for bacteria.  Nasal cytology. A sample of mucus is taken from your nose and examined by your health care provider to determine if your sinusitis is related to an allergy. TREATMENT Most cases of acute sinusitis are related to a viral infection and will resolve on their own within 10 days. Sometimes, medicines are prescribed to help relieve symptoms of both acute and chronic sinusitis. These may include pain medicines, decongestants, nasal steroid sprays, or saline sprays. However, for sinusitis related to a bacterial infection, your health care provider will prescribe antibiotic medicines. These are medicines that will help kill the bacteria causing the infection. Rarely, sinusitis is caused by a fungal infection. In these cases, your health care provider will prescribe antifungal medicine. For some cases of chronic sinusitis, surgery is needed. Generally, these are cases in which sinusitis recurs more than 3 times per year, despite other treatments. HOME CARE INSTRUCTIONS  Drink plenty of water. Water helps thin the mucus so your sinuses can drain more easily.  Use a humidifier.  Inhale steam 3-4 times a day (for example, sit in the bathroom with the shower running).  Apply a warm, moist washcloth to your face 3-4 times a day, or as directed by your health care provider.  Use saline nasal sprays to help   moisten and clean your sinuses.  Take medicines only as directed by your health care provider.  If you were prescribed either an antibiotic or antifungal medicine, finish it all even if you start to feel better. SEEK IMMEDIATE MEDICAL CARE IF:  You have  increasing pain or severe headaches.  You have nausea, vomiting, or drowsiness.  You have swelling around your face.  You have vision problems.  You have a stiff neck.  You have difficulty breathing.   This information is not intended to replace advice given to you by your health care provider. Make sure you discuss any questions you have with your health care provider.   Document Released: 05/08/2005 Document Revised: 05/29/2014 Document Reviewed: 05/23/2011 Elsevier Interactive Patient Education 2016 [employer]  - Take meds as prescribed - Use a cool mist humidifier  -Use saline nose sprays frequently -Saline irrigations of the nose can be very helpful if done frequently.  * 4X daily for 1 week*  * Use of a nettie pot can be helpful with this. Follow directions with this* -Force fluids -For any cough or congestion  Use plain Mucinex- regular strength or max strength is fine   * Children- consult with Pharmacist for dosing -For fever or aces or pains- take tylenol or ibuprofen appropriate for age and weight.  * for fevers greater than 101 orally you may alternate ibuprofen and tylenol every  3 hours. -Throat lozenges if help -New toothbrush in 3 days   Jourdon Zimmerle, FNP   

## 2015-11-24 NOTE — Progress Notes (Signed)
Subjective:    Patient ID: Christy Marshall, female    DOB: 01/18/1976, 40 y.o.   MRN: [id number]  Sore Throat  Associated symptoms include congestion, coughing, ear pain, headaches and a hoarse voice. Pertinent negatives include no shortness of breath.  Sinus Problem This is a new problem. The current episode started in the past 7 days. The problem has been gradually worsening since onset. There has been no fever. Her pain is at a severity of 8/10. The pain is moderate. Associated symptoms include congestion, coughing, ear pain, headaches, a hoarse voice, sinus pressure and a sore throat. Pertinent negatives include no chills, shortness of breath or sneezing. Past treatments include oral decongestants, saline sprays and acetaminophen. The treatment provided mild relief.  Cough Associated symptoms include ear pain, headaches and a sore throat. Pertinent negatives include no chills or shortness of breath.      Review of Systems  Constitutional: Negative.  Negative for chills.  HENT: Positive for congestion, ear pain, hoarse voice, sinus pressure and sore throat. Negative for sneezing.   Eyes: Negative.   Respiratory: Positive for cough. Negative for shortness of breath.   Cardiovascular: Negative.  Negative for palpitations.  Gastrointestinal: Negative.   Endocrine: Negative.   Genitourinary: Negative.   Musculoskeletal: Negative.   Neurological: Positive for headaches.  Hematological: Negative.   Psychiatric/Behavioral: Negative.   All other systems reviewed and are negative.      Objective:   Physical Exam  Constitutional: She is oriented to person, place, and time. She appears well-developed and well-nourished. No distress.  HENT:  Head: Normocephalic and atraumatic.  Right Ear: External ear normal.  Left Ear: External ear normal.  Nose: Mucosal edema and rhinorrhea present. Right sinus exhibits frontal sinus tenderness. Left sinus exhibits frontal sinus tenderness.    Mouth/Throat: Posterior oropharyngeal erythema present.  Eyes: Pupils are equal, round, and reactive to light.  Neck: Normal range of motion. Neck supple. No thyromegaly present.  Cardiovascular: Normal rate, regular rhythm, normal heart sounds and intact distal pulses.   No murmur heard. Pulmonary/Chest: Effort normal and breath sounds normal. No respiratory distress. She has no wheezes.  Abdominal: Soft. Bowel sounds are normal. She exhibits no distension. There is no tenderness.  Musculoskeletal: Normal range of motion. She exhibits no edema or tenderness.  Neurological: She is alert and oriented to person, place, and time. She has normal reflexes. No cranial nerve deficit.  Skin: Skin is warm and dry.  Psychiatric: She has a normal mood and affect. Her behavior is normal. Judgment and thought content normal.  Vitals reviewed.     BP 143/107 mmHg  Pulse 88  Temp(Src) 97.7 F (36.5 C) (Oral)  Ht 5\' 2"  (1.575 m)  Wt 253 lb (114.76 kg)  BMI 46.26 kg/m2     Assessment & Plan:  1. Acute frontal sinusitis, recurrence not specified -- Take meds as prescribed - Use a cool mist humidifier  -Use saline nose sprays frequently -Saline irrigations of the nose can be very helpful if done frequently.  * 4X daily for 1 week*  * Use of a nettie pot can be helpful with this. Follow directions with this* -Force fluids -For any cough or congestion  Use plain Mucinex- regular strength or max strength is fine   * Children- consult with Pharmacist for dosing -For fever or aces or pains- take tylenol or ibuprofen appropriate for age and weight.  * for fevers greater than 101 orally you may alternate ibuprofen and tylenol every  3  hours. -Throat lozenges if help -New toothbrush in 3 days - azithromycin (ZITHROMAX Z-PAK) 250 MG tablet; As directed  Dispense: 1 each; Refill: 0 - fluticasone (FLONASE) 50 MCG/ACT nasal spray; Place 2 sprays into both nostrils daily.  Dispense: 16 g; Refill:  6  Jannifer Rodneyhristy Hawks, FNP

## 2015-11-29 ENCOUNTER — Other Ambulatory Visit: Payer: Self-pay | Admitting: Family Medicine

## 2015-12-07 ENCOUNTER — Ambulatory Visit (INDEPENDENT_AMBULATORY_CARE_PROVIDER_SITE_OTHER): Payer: PRIVATE HEALTH INSURANCE

## 2015-12-07 ENCOUNTER — Ambulatory Visit (INDEPENDENT_AMBULATORY_CARE_PROVIDER_SITE_OTHER): Payer: PRIVATE HEALTH INSURANCE | Admitting: Family Medicine

## 2015-12-07 ENCOUNTER — Encounter: Payer: Self-pay | Admitting: Family Medicine

## 2015-12-07 VITALS — BP 118/84 | HR 87 | Temp 97.1°F | Ht 62.0 in | Wt 246.0 lb

## 2015-12-07 DIAGNOSIS — S0033XA Contusion of nose, initial encounter: Secondary | ICD-10-CM | POA: Diagnosis not present

## 2015-12-07 DIAGNOSIS — S161XXA Strain of muscle, fascia and tendon at neck level, initial encounter: Secondary | ICD-10-CM

## 2015-12-07 DIAGNOSIS — J3489 Other specified disorders of nose and nasal sinuses: Secondary | ICD-10-CM

## 2015-12-07 DIAGNOSIS — M542 Cervicalgia: Secondary | ICD-10-CM

## 2015-12-07 DIAGNOSIS — R519 Headache, unspecified: Secondary | ICD-10-CM

## 2015-12-07 DIAGNOSIS — R51 Headache: Secondary | ICD-10-CM

## 2015-12-07 MED ORDER — MELOXICAM 15 MG PO TABS: 15.0000 mg | ORAL_TABLET | Freq: Every day | ORAL | Status: DC

## 2015-12-07 NOTE — Patient Instructions (Signed)
Use warm wet compresses to neck 20 minutes 3 or 4 times daily Avoid heavy lifting pushing or pulling Take meloxicam 15 mg 1 daily after eating Do not take any more ibuprofen We will call with results of the x-rays as soon as they become available

## 2015-12-07 NOTE — Addendum Note (Signed)
Addended by: Magdalene RiverBULLINS, Lowanda Cashaw H on: 12/07/2015 08:32 AM   Modules accepted: Orders
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
## 2015-12-21 ENCOUNTER — Ambulatory Visit: Payer: PRIVATE HEALTH INSURANCE | Admitting: Family Medicine

## 2016-01-05 ENCOUNTER — Other Ambulatory Visit: Payer: Self-pay | Admitting: *Deleted

## 2016-01-05 ENCOUNTER — Other Ambulatory Visit: Payer: Self-pay | Admitting: Family Medicine

## 2016-01-05 MED ORDER — MELOXICAM 15 MG PO TABS: 15.0000 mg | ORAL_TABLET | Freq: Every day | ORAL | 1 refills | Status: DC

## 2016-01-05 NOTE — Telephone Encounter (Signed)
Pt aware [facility]

## 2016-03-07 ENCOUNTER — Other Ambulatory Visit: Payer: Self-pay

## 2016-03-07 MED ORDER — DULOXETINE HCL 60 MG PO CPEP: 60.0000 mg | ORAL_CAPSULE | Freq: Every day | ORAL | 1 refills | Status: AC

## 2016-05-10 LAB — HM DIABETES EYE EXAM

## 2016-05-11 ENCOUNTER — Other Ambulatory Visit: Payer: Self-pay

## 2016-05-11 MED ORDER — FLUTICASONE PROPIONATE 50 MCG/ACT NA SUSP: 2.0000 | Freq: Every day | NASAL | 12 refills | Status: AC

## 2016-06-16 ENCOUNTER — Other Ambulatory Visit: Payer: Self-pay | Admitting: Family Medicine

## 2016-06-27 ENCOUNTER — Ambulatory Visit (INDEPENDENT_AMBULATORY_CARE_PROVIDER_SITE_OTHER): Payer: Self-pay | Admitting: Physician Assistant

## 2016-06-27 ENCOUNTER — Encounter: Payer: Self-pay | Admitting: Physician Assistant

## 2016-06-27 VITALS — BP 112/80 | HR 93 | Temp 97.3°F | Ht 62.0 in | Wt 249.2 lb

## 2016-06-27 DIAGNOSIS — R059 Cough, unspecified: Secondary | ICD-10-CM

## 2016-06-27 DIAGNOSIS — J40 Bronchitis, not specified as acute or chronic: Secondary | ICD-10-CM

## 2016-06-27 DIAGNOSIS — R05 Cough: Secondary | ICD-10-CM

## 2016-06-27 MED ORDER — BUDESONIDE-FORMOTEROL FUMARATE 160-4.5 MCG/ACT IN AERO: 2.0000 | INHALATION_SPRAY | Freq: Two times a day (BID) | RESPIRATORY_TRACT | 0 refills | Status: AC

## 2016-06-27 MED ORDER — METHYLPREDNISOLONE ACETATE 80 MG/ML IJ SUSP
80.0000 mg | Freq: Once | INTRAMUSCULAR | Status: AC
  Administered 2016-06-27: 80 mg via INTRAMUSCULAR

## 2016-06-27 MED ORDER — AZITHROMYCIN 250 MG PO TABS: ORAL_TABLET | ORAL | 0 refills | Status: AC

## 2016-06-27 NOTE — Patient Instructions (Signed)
In a few days you may receive a survey in the mail or online from [employer] regarding your visit with us today. Please take a moment to fill this out. Your feedback is very important to our whole office. It can help us better understand your needs as well as improve your experience and satisfaction. Thank you for taking your time to complete it. We care about you.  Gunnison Chahal, PA-C  Acute Bronchitis, Adult Acute bronchitis is sudden (acute) swelling of the air tubes (bronchi) in the lungs. Acute bronchitis causes these tubes to fill with mucus, which can make it hard to breathe. It can also cause coughing or wheezing. In adults, acute bronchitis usually goes away within 2 weeks. A cough caused by bronchitis may last up to 3 weeks. Smoking, allergies, and asthma can make the condition worse. Repeated episodes of bronchitis may cause further lung problems, such as chronic obstructive pulmonary disease (COPD). What are the causes? This condition can be caused by germs and by substances that irritate the lungs, including:  Cold and flu viruses. This condition is most often caused by the same virus that causes a cold.  Bacteria.  Exposure to tobacco smoke, dust, fumes, and air pollution. What increases the risk? This condition is more likely to develop in people who:  Have close contact with someone with acute bronchitis.  Are exposed to lung irritants, such as tobacco smoke, dust, fumes, and vapors.  Have a weak immune system.  Have a respiratory condition such as asthma. What are the signs or symptoms? Symptoms of this condition include:  A cough.  Coughing up clear, yellow, or green mucus.  Wheezing.  Chest congestion.  Shortness of breath.  A fever.  Body aches.  Chills.  A sore throat. How is this diagnosed? This condition is usually diagnosed with a physical exam. During the exam, your health care provider may order tests, such as chest X-rays, to rule out other  conditions. He or she may also:  Test a sample of your mucus for bacterial infection.  Check the level of oxygen in your blood. This is done to check for pneumonia.  Do a chest X-ray or lung function testing to rule out pneumonia and other conditions.  Perform blood tests. Your health care provider will also ask about your symptoms and medical history. How is this treated? Most cases of acute bronchitis clear up over time without treatment. Your health care provider may recommend:  Drinking more fluids. Drinking more makes your mucus thinner, which may make it easier to breathe.  Taking a medicine for a fever or cough.  Taking an antibiotic medicine.  Using an inhaler to help improve shortness of breath and to control a cough.  Using a cool mist vaporizer or humidifier to make it easier to breathe. Follow these instructions at home: Medicines  Take over-the-counter and prescription medicines only as told by your health care provider.  If you were prescribed an antibiotic, take it as told by your health care provider. Do not stop taking the antibiotic even if you start to feel better. General instructions  Get plenty of rest.  Drink enough fluids to keep your urine clear or pale yellow.  Avoid smoking and secondhand smoke. Exposure to cigarette smoke or irritating chemicals will make bronchitis worse. If you smoke and you need help quitting, ask your health care provider. Quitting smoking will help your lungs heal faster.  Use an inhaler, cool mist vaporizer, or humidifier as told by   your health care provider.  Keep all follow-up visits as told by your health care provider. This is important. How is this prevented? To lower your risk of getting this condition again:  Wash your hands often with soap and water. If soap and water are not available, use hand sanitizer.  Avoid contact with people who have cold symptoms.  Try not to touch your hands to your mouth, nose, or  eyes.  Make sure to get the flu shot every year. Contact a health care provider if:  Your symptoms do not improve in 2 weeks of treatment. Get help right away if:  You cough up blood.  You have chest pain.  You have severe shortness of breath.  You become dehydrated.  You faint or keep feeling like you are going to faint.  You keep vomiting.  You have a severe headache.  Your fever or chills gets worse. This information is not intended to replace advice given to you by your health care provider. Make sure you discuss any questions you have with your health care provider. Document Released: 06/15/2004 Document Revised: 12/01/2015 Document Reviewed: 10/27/2015 Elsevier Interactive Patient Education  2017 [employer]  

## 2016-06-27 NOTE — Progress Notes (Signed)
BP 112/80   Pulse 93   Temp 97.3 F (36.3 C) (Oral)   Ht 5\' 2"  (1.575 m)   Wt 249 lb 3.2 oz (113 kg)   BMI 45.58 kg/m    Subjective:    Patient ID: Christy Marshall, female    DOB: 07-12-1975, 41 y.o.   MRN: [id number]  HPI: Christy Marshall is a 41 y.o. female presenting on 06/27/2016 for Headache; Sore Throat; and Cough  Patient with several days of progressing upper respiratory and bronchial symptoms. Initially there was more upper respiratory congestion. This progressed to having significant cough that is productive throughout the day and severe at night. There is occasional wheezing after coughing. They will sometimes have slight dyspnea on exertion. It is productive mucus that is yellow in color. Denies any blood.   Relevant past medical, surgical, family and social history reviewed and updated as indicated. Allergies and medications reviewed and updated.  Past Medical History:  Diagnosis Date  . Arthritis   . Fibromyalgia 2012  . GERD (gastroesophageal reflux disease)   . Hypothyroidism   . Morbid obesity (HCC)     Past Surgical History:  Procedure Laterality Date  . JOINT REPLACEMENT Bilateral 10/2011   partial joint  . KNEE SURGERY      Review of Systems  Constitutional: Positive for chills and fatigue. Negative for activity change and appetite change.  HENT: Positive for congestion, postnasal drip and sore throat.   Eyes: Negative.   Respiratory: Positive for cough and wheezing.   Cardiovascular: Negative.  Negative for chest pain, palpitations and leg swelling.  Gastrointestinal: Negative.   Genitourinary: Negative.   Musculoskeletal: Negative.   Skin: Negative.   Neurological: Positive for headaches.    Allergies as of 06/27/2016      Reactions   Codeine Other (See Comments)   vomitting      Medication List       Accurate as of 06/27/16  3:11 PM. Always use your most recent med list.          azithromycin 250 MG tablet Commonly known as:  ZITHROMAX  Z-PAK Take as directed   budesonide-formoterol 160-4.5 MCG/ACT inhaler Commonly known as:  SYMBICORT Inhale 2 puffs into the lungs 2 (two) times daily.   DULoxetine 60 MG capsule Commonly known as:  CYMBALTA Take 1 capsule (60 mg total) by mouth daily.   fluticasone 50 MCG/ACT nasal spray Commonly known as:  FLONASE Place 2 sprays into both nostrils daily.   lansoprazole 30 MG capsule Commonly known as:  PREVACID Take 1 capsule (30 mg total) by mouth 2 (two) times daily before a meal.   levothyroxine 50 MCG tablet Commonly known as:  SYNTHROID, LEVOTHROID TAKE 1 TABLET (50 MCG TOTAL) BY MOUTH DAILY.   metFORMIN 500 MG tablet Commonly known as:  GLUCOPHAGE TAKE 1 TABLET (500 MG TOTAL) BY MOUTH 2 (TWO) TIMES DAILY WITH A MEAL.          Objective:    BP 112/80   Pulse 93   Temp 97.3 F (36.3 C) (Oral)   Ht 5\' 2"  (1.575 m)   Wt 249 lb 3.2 oz (113 kg)   BMI 45.58 kg/m   Allergies  Allergen Reactions  . Codeine Other (See Comments)    vomitting    Physical Exam  Constitutional: She is oriented to person, place, and time. She appears well-developed and well-nourished.  HENT:  Head: Normocephalic and atraumatic.  Right Ear: There is drainage and tenderness.  Left Ear:  There is drainage and tenderness.  Nose: Mucosal edema and rhinorrhea present. Right sinus exhibits maxillary sinus tenderness and frontal sinus tenderness. Left sinus exhibits maxillary sinus tenderness and frontal sinus tenderness.  Mouth/Throat: Oropharyngeal exudate and posterior oropharyngeal erythema present.  Eyes: Conjunctivae and EOM are normal. Pupils are equal, round, and reactive to light.  Neck: Normal range of motion. Neck supple.  Cardiovascular: Normal rate, regular rhythm, normal heart sounds and intact distal pulses.   Pulmonary/Chest: Effort normal. She has wheezes in the right upper field and the left upper field.  Abdominal: Soft. Bowel sounds are normal.  Neurological: She is  alert and oriented to person, place, and time. She has normal reflexes.  Skin: Skin is warm and dry. No rash noted.  Psychiatric: She has a normal mood and affect. Her behavior is normal. Judgment and thought content normal.    Results for orders placed or performed in visit on 05/25/16  HM DIABETES EYE EXAM  Result Value Ref Range   HM Diabetic Eye Exam No Retinopathy No Retinopathy      Assessment & Plan:   1. Bronchitis - azithromycin (ZITHROMAX Z-PAK) 250 MG tablet; Take as directed  Dispense: 6 each; Refill: 0 - methylPREDNISolone acetate (DEPO-MEDROL) injection 80 mg; Inject 1 mL (80 mg total) into the muscle once. - budesonide-formoterol (SYMBICORT) 160-4.5 MCG/ACT inhaler; Inhale 2 puffs into the lungs 2 (two) times daily.  Dispense: 1 Inhaler; Refill: 0  2. Cough   Continue all other maintenance medications as listed above.  Follow up plan: Follow-up as needed or worsening of symptoms. Call office for any issues.  Educational handout given for bronchitis  Remus LofflerAngel S. Mekhia Brogan PA-C [facility] [address]  [place], [place] [address] [phone]   06/27/2016, 3:11 PM

## 2016-08-13 ENCOUNTER — Other Ambulatory Visit: Payer: Self-pay | Admitting: Family Medicine

## 2016-09-13 ENCOUNTER — Other Ambulatory Visit: Payer: Self-pay | Admitting: Family
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
